# Patient Record
Sex: Male | Born: 1965 | Race: Black or African American | Hispanic: No | Smoking: Never smoker
Health system: Southern US, Community
[De-identification: ages and names within clinical notes are randomized; demographics above are authoritative.]

## PROBLEM LIST (undated history)

## (undated) DIAGNOSIS — E119 Type 2 diabetes mellitus without complications: Secondary | ICD-10-CM

## (undated) DIAGNOSIS — I10 Essential (primary) hypertension: Secondary | ICD-10-CM

---

## 2016-10-18 ENCOUNTER — Emergency Department (HOSPITAL_COMMUNITY): Payer: Commercial Managed Care - HMO

## 2016-10-18 ENCOUNTER — Emergency Department (HOSPITAL_COMMUNITY)
Admission: EM | Admit: 2016-10-18 | Discharge: 2016-10-18 | Disposition: A | Discharge status: Home / Self Care | Payer: Commercial Managed Care - HMO | Attending: Emergency Medicine | Admitting: Emergency Medicine

## 2016-10-18 ENCOUNTER — Encounter (HOSPITAL_COMMUNITY): Payer: Self-pay

## 2016-10-18 DIAGNOSIS — Z7982 Long term (current) use of aspirin: Secondary | ICD-10-CM | POA: Diagnosis not present

## 2016-10-18 DIAGNOSIS — R338 Other retention of urine: Secondary | ICD-10-CM

## 2016-10-18 DIAGNOSIS — R339 Retention of urine, unspecified: Secondary | ICD-10-CM | POA: Diagnosis not present

## 2016-10-18 DIAGNOSIS — Z7984 Long term (current) use of oral hypoglycemic drugs: Secondary | ICD-10-CM | POA: Diagnosis not present

## 2016-10-18 DIAGNOSIS — Z79899 Other long term (current) drug therapy: Secondary | ICD-10-CM | POA: Insufficient documentation

## 2016-10-18 DIAGNOSIS — E119 Type 2 diabetes mellitus without complications: Secondary | ICD-10-CM | POA: Insufficient documentation

## 2016-10-18 DIAGNOSIS — R39198 Other difficulties with micturition: Secondary | ICD-10-CM

## 2016-10-18 DIAGNOSIS — I10 Essential (primary) hypertension: Secondary | ICD-10-CM | POA: Diagnosis not present

## 2016-10-18 HISTORY — DX: Type 2 diabetes mellitus without complications: E11.9

## 2016-10-18 HISTORY — DX: Essential (primary) hypertension: I10

## 2016-10-18 LAB — URINALYSIS, ROUTINE W REFLEX MICROSCOPIC
Bilirubin Urine: NEGATIVE
GLUCOSE, UA: NEGATIVE mg/dL
KETONES UR: NEGATIVE mg/dL
NITRITE: NEGATIVE
PROTEIN: 30 mg/dL — AB
Specific Gravity, Urine: 1.017 (ref 1.005–1.030)
pH: 5 (ref 5.0–8.0)

## 2016-10-18 LAB — I-STAT CHEM 8, ED
BUN: 13 mg/dL (ref 6–20)
CALCIUM ION: 1.14 mmol/L — AB (ref 1.15–1.40)
Chloride: 105 mmol/L (ref 101–111)
Creatinine, Ser: 1.2 mg/dL (ref 0.61–1.24)
GLUCOSE: 126 mg/dL — AB (ref 65–99)
HCT: 35 % — ABNORMAL LOW (ref 39.0–52.0)
HEMOGLOBIN: 11.9 g/dL — AB (ref 13.0–17.0)
POTASSIUM: 3.6 mmol/L (ref 3.5–5.1)
Sodium: 142 mmol/L (ref 135–145)
TCO2: 24 mmol/L (ref 0–100)

## 2016-10-18 NOTE — ED Triage Notes (Addendum)
Pt presents with c/o urinary retention. Pt reports he has been unable to pass any urine since 6 pm last evening (Monday 2/26). Pt denies any recent catheter insertion. Pt reports he does have a hx of enlarged prostate. Pt was sent here by POV for catheter insertion [facility] as they were unable to place the catheter.

## 2016-10-18 NOTE — ED Notes (Addendum)
Pt able to ambulate self to restroom and urinate without difficulty.

## 2016-10-18 NOTE — ED Provider Notes (Signed)
[facility]-EMERGENCY DEPT Provider Note   CSN: [id number] Arrival date & time: 10/18/16  0208   By signing my name below, I, Soijett Blue, attest that this documentation has been prepared under the direction and in the presence of Tomasita Crumble, MD. Electronically Signed: Soijett Blue, ED Scribe. 10/18/16. 3:04 AM.  History   Chief Complaint Chief Complaint  Patient presents with  . Urinary Retention    HPI Joseph Moreno is a 51 y.o. male with a PMHx of DM, HTN, who presents to the Emergency Department complaining of urinary retention onset 6 PM last night. Pt reports associated dysuria. Pt has tried flomax and ciproflaxcin with no relief of his symptoms. Pt reports that he was transferred from [facility] for catheter insertion due to them being unable to place one at their facility. He notes that he was diagnosed with an enlarged prostate x 7 months ago and was prescribed flomax. He states that he was seen by his PCP and it was found that he had bacteria in his UA culture and was Rx ciproflaxcin prior to the onset of his symptoms. 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 He denies hematuria and any other symptoms.   The history is provided by the patient. No language interpreter was used.    Past Medical History:  Diagnosis Date  . Diabetes mellitus without complication (HCC)   . Hypertension     There are no active problems to display for this patient.   History reviewed. No pertinent surgical history.     Home Medications    Prior to Admission medications   Medication Sig Start Date End Date Taking? Authorizing Provider  aspirin EC 81 MG tablet Take 81 mg by mouth daily.   Yes Historical Provider, MD  ciprofloxacin (CIPRO) 750 MG tablet Take 750 mg by mouth 2 (two) times daily. 10/13/16  Yes Historical Provider, MD  hydrALAZINE (APRESOLINE) 100 MG tablet Take 100 mg by mouth 3 (three) times daily. 10/07/16  Yes Historical Provider,  MD  lisinopril-hydrochlorothiazide (PRINZIDE,ZESTORETIC) 20-25 MG tablet Take 1 tablet by mouth daily. 09/17/16  Yes Historical Provider, MD  meloxicam (MOBIC) 15 MG tablet Take 15 mg by mouth daily. 10/07/16  Yes Historical Provider, MD  metFORMIN (GLUCOPHAGE) 500 MG tablet Take 500 mg by mouth 2 (two) times daily. 10/07/16  Yes Historical Provider, MD  tamsulosin (FLOMAX) 0.4 MG CAPS capsule Take 0.4 mg by mouth daily. 09/17/16  Yes Historical Provider, MD  vitamin B-12 (CYANOCOBALAMIN) 1000 MCG tablet Take 1,000 mcg by mouth daily.   Yes Historical Provider, MD    Family History No family history on file.  Social History Social History  Substance Use Topics  . Smoking status: Never Smoker  . Smokeless tobacco: Never Used  . Alcohol use No     Allergies   Patient has no known allergies.   Review of Systems Review of Systems A complete 10 system review of systems was obtained and all systems are negative except as noted in the HPI and PMH.   Physical Exam Updated Vital Signs BP 181/94 (BP Location: Right Arm)   Pulse 102   Temp 98.2 F (36.8 C) (Oral)   Resp 16   Ht 5\' 6"  (1.676 m)   Wt (!) 400 lb (181.4 kg)   SpO2 100%   BMI 64.56 kg/m   Physical Exam  Constitutional: He is oriented to person, place, and time. Vital signs are normal. He appears  well-developed and well-nourished.  Non-toxic appearance. He does not appear ill. He appears distressed.  Obese. Distressed.  HENT:  Head: Normocephalic and atraumatic.  Nose: Nose normal.  Mouth/Throat: Oropharynx is clear and moist. No oropharyngeal exudate.  Eyes: Conjunctivae and EOM are normal. Pupils are equal, round, and reactive to light. No scleral icterus.  Neck: Normal range of motion. Neck supple. No tracheal deviation, no edema, no erythema and normal range of motion present. No thyroid mass and no thyromegaly present.  Cardiovascular: Normal rate, regular rhythm, S1 normal, S2 normal, normal heart sounds, intact  distal pulses and normal pulses.  Exam reveals no gallop and no friction rub.   No murmur heard. Pulmonary/Chest: Effort normal and breath sounds normal. No respiratory distress. He has no wheezes. He has no rhonchi. He has no rales.  Abdominal: Soft. Normal appearance and bowel sounds are normal. He exhibits no distension, no ascites and no mass. There is no hepatosplenomegaly. There is tenderness in the suprapubic area. There is no rebound, no guarding and no CVA tenderness.  Suprapubic TTP  Musculoskeletal: Normal range of motion. He exhibits no edema or tenderness.  Lymphadenopathy:    He has no cervical adenopathy.  Neurological: He is alert and oriented to person, place, and time. He has normal strength. No cranial nerve deficit or sensory deficit.  Skin: Skin is warm, dry and intact. No petechiae and no rash noted. He is not diaphoretic. No erythema. No pallor.  Nursing note and vitals reviewed.    ED Treatments / Results  DIAGNOSTIC STUDIES: Oxygen Saturation is 98% on RA, nl by my interpretation.    COORDINATION OF CARE: 2:42 AM Discussed treatment plan with pt at bedside which includes bladder scan, UA, consult neurology, and pt agreed to plan.   Labs (all labs ordered are listed, but only abnormal results are displayed) Labs Reviewed  URINALYSIS, ROUTINE W REFLEX MICROSCOPIC - Abnormal; Notable for the following:       Result Value   APPearance CLOUDY (*)    Hgb urine dipstick SMALL (*)    Protein, ur 30 (*)    Leukocytes, UA LARGE (*)    Bacteria, UA MANY (*)    Squamous Epithelial / LPF 0-5 (*)    All other components within normal limits  URINE CULTURE  I-STAT CHEM 8, ED    EKG  EKG Interpretation None       Radiology [place] Pelvis Limited  Result Date: 10/18/2016 CLINICAL DATA:  Difficulty voiding.  Evaluate postvoid residual. EXAM: LIMITED ULTRASOUND OF PELVIS TECHNIQUE: Limited transabdominal ultrasound examination of the pelvis was performed. COMPARISON:   None. FINDINGS: Urinary bladder is well distended with anechoic contents on prevoid imaging; bilateral ureteral jets identified. Prevoid volume:  752 cc. Postvoid volume:  690 cc. Prostate and seminal vesicles are not sonographically identified, not tailored for evaluation. IMPRESSION: Marked postvoid urinary bladder residual, 690 cc. Electronically Signed   By: Awilda Metroourtnay  Bloomer M.D.   On: 10/18/2016 04:48    Procedures Procedures (including critical care time)  Medications Ordered in ED Medications - No data to display   Initial Impression / Assessment and Plan / ED Course  I have reviewed the triage vital signs and the nursing notes.  Pertinent labs & imaging results that were available during my care of the patient were reviewed by me and considered in my medical decision making (see chart for details).     Patient presents to the ED for urinary retention. I could not place a foley  because I could not retract his foreskin back to see his penis.  After my manipulation, he was able to void a significant amount and takes he feels back to his normal state. I consulted with Dr. Annabell Howells who advises for post void residual [place] to be done.  This revealed significant retention with 700cc in the bladder.  When he came back to the ED however, he continued to void multiple times and states he feels completely empty and normal again. He has no pain.  Patient is safe for DC at this point. Dr. Annabell Howells recs for creatinine level prior to DC and to fu in clinic.  Advised to continue cipro for his UTI as well. He demonstrates good understanding and is anxious to leave and go home, appears well and in NAD. Creatinine is 1.2. Patient is safe for DC.      Final Clinical Impressions(s) / ED Diagnoses   Final diagnoses:  Acute urinary retention    New Prescriptions New Prescriptions   No medications on file    I personally performed the services described in this documentation, which was scribed in my  presence. The recorded information has been reviewed and is accurate.      Tomasita Crumble, MD 10/18/16 [phone]

## 2016-10-20 LAB — URINE CULTURE: Culture: >=100000 — AB

## 2016-10-21 ENCOUNTER — Telehealth: Payer: Self-pay | Admitting: Emergency Medicine

## 2016-10-21 NOTE — Progress Notes (Signed)
ED Antimicrobial Stewardship Positive Culture Follow Up  [name] is an 51 y.o. male who [facility] with a chief complaint of  Chief Complaint  Patient presents with  . Urinary Retention   ? Recent Results (from the past 720 hour(s))  Urine culture     Status: Abnormal   Collection Time: 10/18/16  3:42 AM  Result Value Ref Range Status   Specimen Description URINE, CATHETERIZED  Final   Special Requests NONE  Final   Culture >=100,000 COLONIES/mL ESCHERICHIA COLI (A)  Final   Report Status 10/20/2016 FINAL  Final   Organism ID, Bacteria ESCHERICHIA COLI (A)  Final      Susceptibility   Escherichia coli - MIC*    AMPICILLIN >=32 RESISTANT Resistant     CEFAZOLIN <=4 SENSITIVE Sensitive     CEFTRIAXONE <=1 SENSITIVE Sensitive     CIPROFLOXACIN >=4 RESISTANT Resistant     GENTAMICIN >=16 RESISTANT Resistant     IMIPENEM <=0.25 SENSITIVE Sensitive     NITROFURANTOIN <=16 SENSITIVE Sensitive     TRIMETH/SULFA >=320 RESISTANT Resistant     AMPICILLIN/SULBACTAM >=32 RESISTANT Resistant     PIP/TAZO 8 SENSITIVE Sensitive     Extended ESBL NEGATIVE Sensitive     * >=100,000 COLONIES/mL ESCHERICHIA COLI   ? Treated with Ciprofloxacin, organism resistant to prescribed antimicrobial Patient discharged originally without antimicrobial agent and treatment is now indicated ? New antibiotic prescription: Macrobid 100 mg PO BID for 7 days. Staff message sent to Dr Annabell HowellsWrenn so he is aware of cx and that we treated for UTI (not prostatitis).   ? ED Provider: Harolyn RutherfordShawn Joy PA-C ? [name] 10/21/2016, 10:30 AM Infectious Diseases Pharmacist Phone# [phone]

## 2016-10-21 NOTE — Telephone Encounter (Signed)
Post ED Visit - Positive Culture Follow-up: Successful Patient Follow-Up  Culture assessed and recommendations reviewed by: []  Joseph Moreno, Pharm.D. []  Joseph Moreno, Pharm.D., BCPS $RemoveBe [id number]$[][id number]_[id number]$[][]  Joseph Moreno, [place] BCPS, AAHIVP []  Joseph Moreno, Pharm.D., BCPS, AAHIVP []  Tennis Mustassie Moreno, Pharm.D. []  Joseph Moreno, [address]  Positive urine culture  []  Patient discharged without antimicrobial prescription and treatment is now indicated [x]  Organism is resistant to prescribed ED discharge antimicrobial []  Patient with positive blood cultures  Changes discussed with ED provider: Harolyn RutherfordShawn Joy Moreno New antibiotic prescription start Macrobid 100mg  po bid x 7 days [name] [phone]  Contacted 10/21/2016 1416   Berle MullMiller, [name] 10/21/2016, 2:15 PM

## 2017-07-15 IMAGING — US US PELVIS LIMITED
1 series · 11 of 11 positions shown · non-contrast
Comparison: None.

CLINICAL DATA: Difficulty voiding.  Evaluate postvoid residual.

EXAM:
LIMITED ULTRASOUND OF PELVIS
TECHNIQUE: Limited transabdominal ultrasound examination of the pelvis was
performed.

[Series 1: us pelvis limited · 0.35mm/px · 11 of 11 slices shown]
[im 1/11]
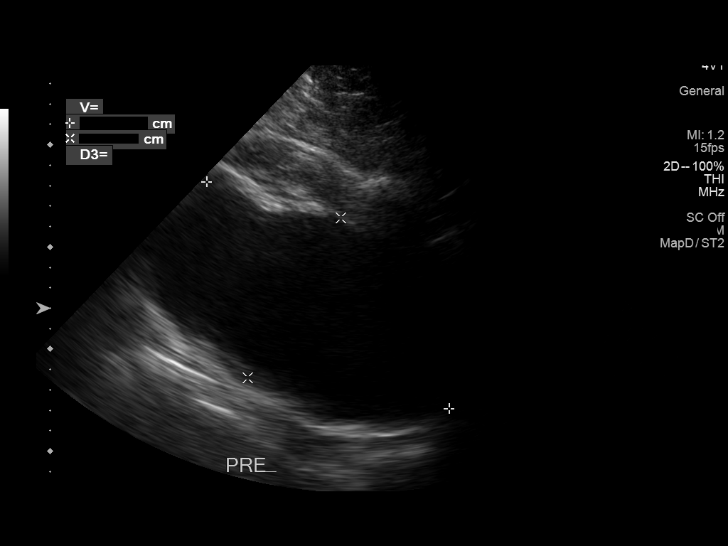
[im 2/11]
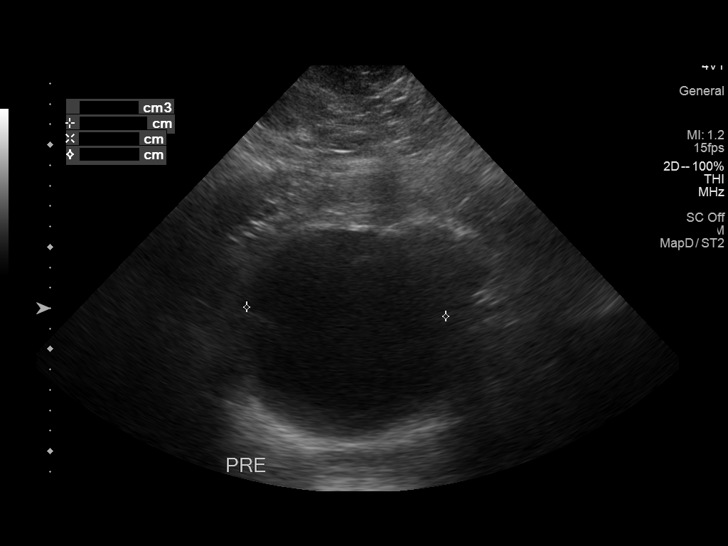
[im 3/11]
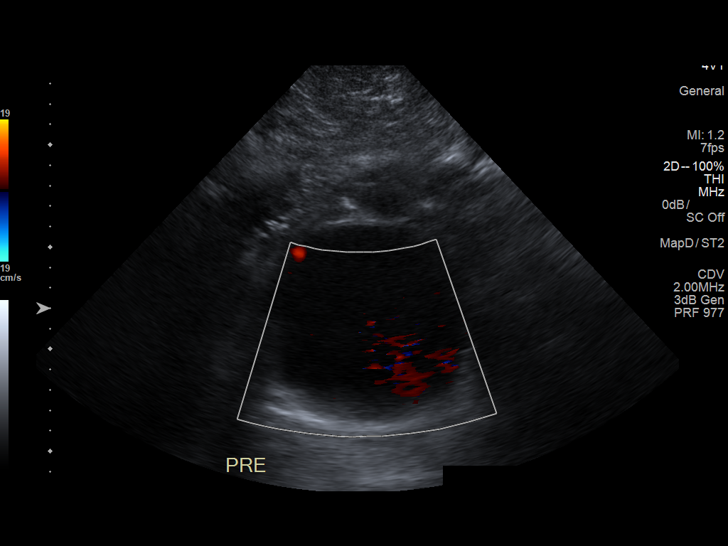
[im 4/11]
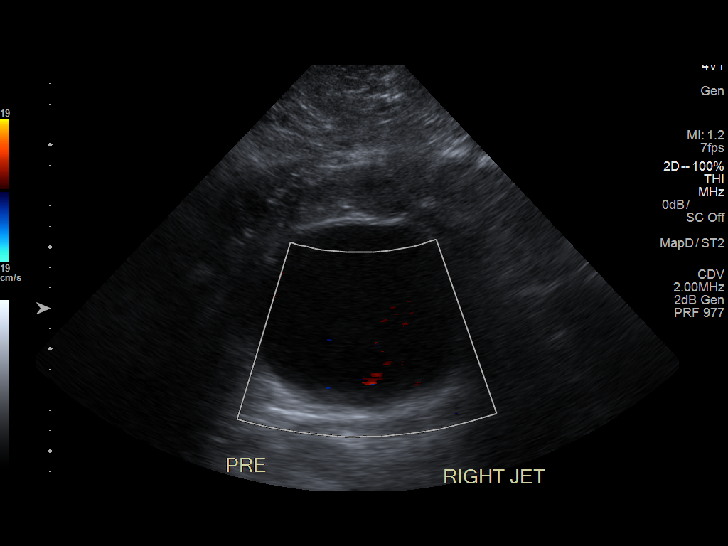
[im 5/11]
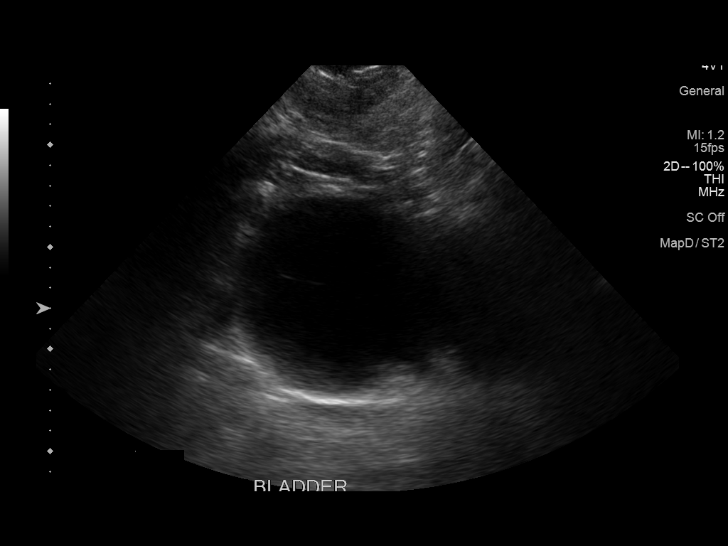
[im 6/11]
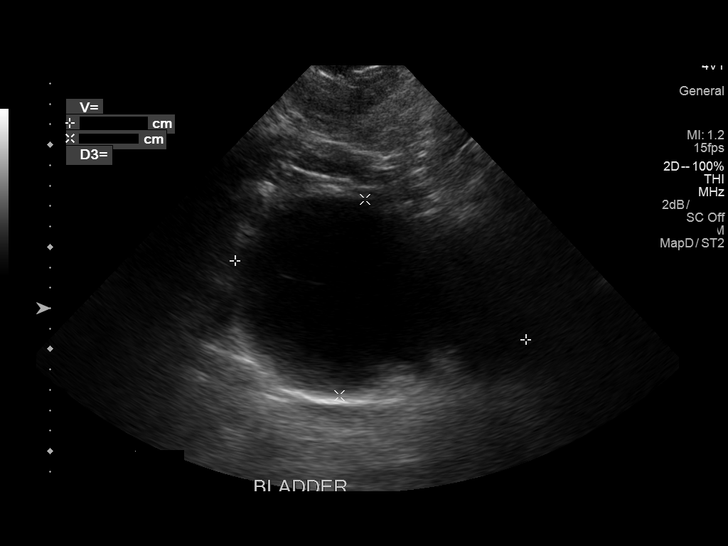
[im 7/11]
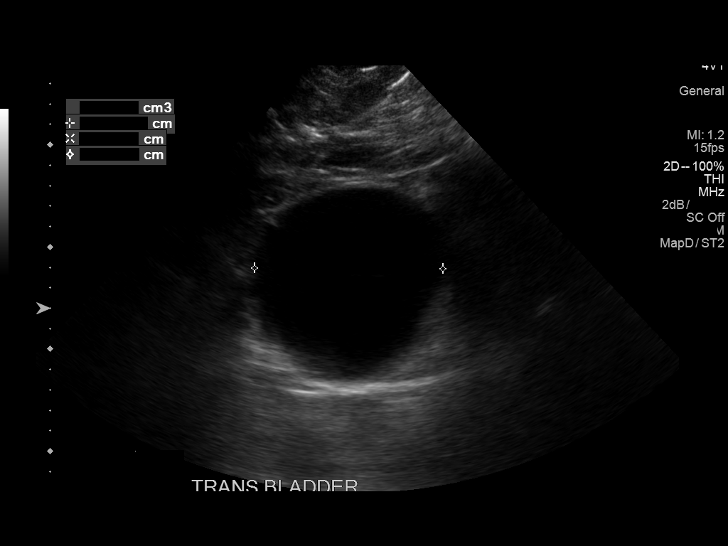
[im 8/11]
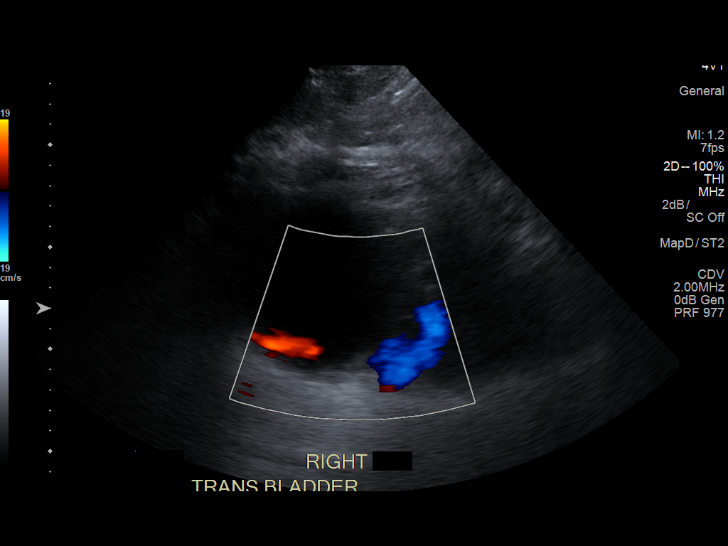
[im 9/11]
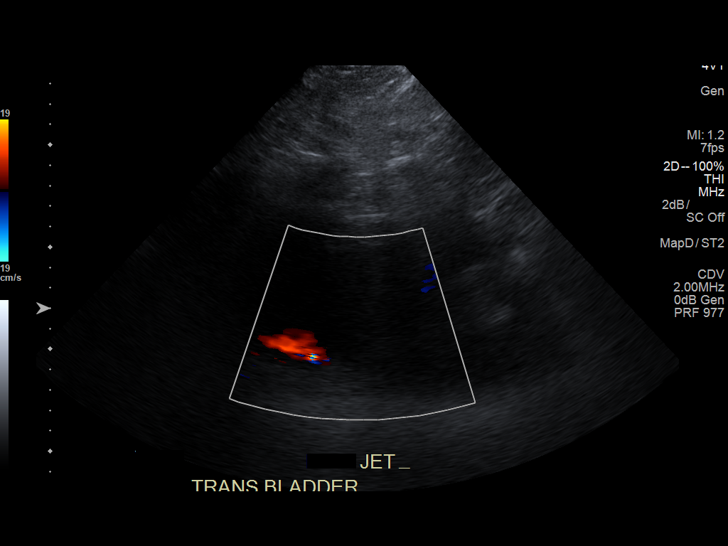
[im 10/11]
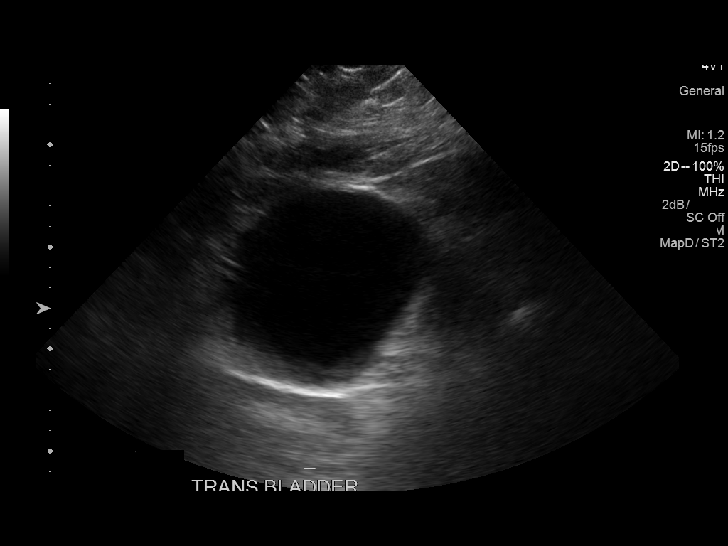
[im 11/11]
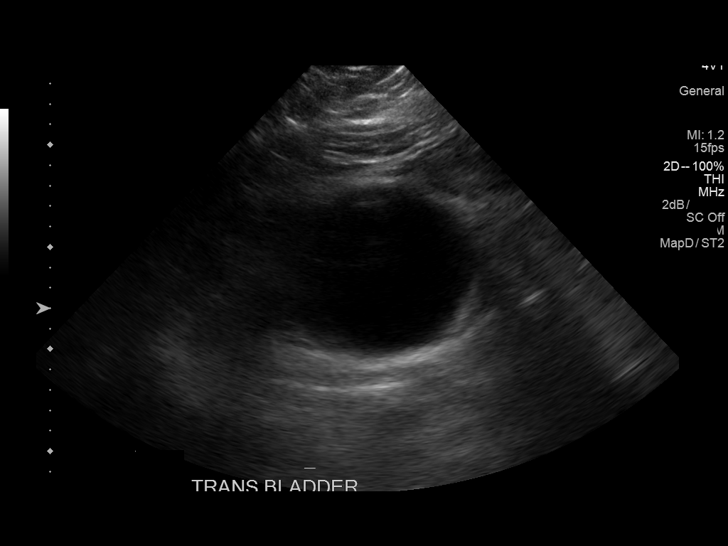

[11 of 11 positions shown; findings below may reference images not displayed]

FINDINGS: Urinary bladder is well distended with anechoic contents on prevoid
imaging; bilateral ureteral jets identified.

Prevoid volume:  752 cc.

Postvoid volume:  690 cc.

Prostate and seminal vesicles are not sonographically identified,
not tailored for evaluation.
IMPRESSION: Marked postvoid urinary bladder residual, 690 cc.

## 2020-09-22 DEATH — deceased
